# Patient Record
Sex: Male | Born: 1965 | Race: White | Hispanic: No | Marital: Married | Smoking: Never smoker
Health system: Southern US, Community
[De-identification: ages and names within clinical notes are randomized; demographics above are authoritative.]

## PROBLEM LIST (undated history)

## (undated) HISTORY — PX: HERNIA REPAIR: SHX51

## (undated) HISTORY — PX: APPENDECTOMY: SHX54

---

## 2008-04-06 ENCOUNTER — Inpatient Hospital Stay: Payer: Self-pay | Admitting: Surgery

## 2008-05-28 ENCOUNTER — Emergency Department: Payer: Self-pay | Admitting: Emergency Medicine

## 2008-05-28 ENCOUNTER — Inpatient Hospital Stay: Payer: Self-pay | Admitting: Internal Medicine

## 2013-11-18 ENCOUNTER — Ambulatory Visit: Payer: Self-pay | Admitting: Surgery

## 2014-04-18 ENCOUNTER — Ambulatory Visit: Payer: Self-pay | Admitting: Gastroenterology

## 2014-05-01 ENCOUNTER — Ambulatory Visit: Payer: Self-pay

## 2014-08-13 NOTE — Op Note (Signed)
PATIENT NAME:  [name], Brian B MR#:  [id number] DATE OF BIRTH:  December 05, 1965  DATE OF PROCEDURE:  11/18/2013  PREOPERATIVE DIAGNOSIS: Incarcerated umbilical hernia.   POSTOPERATIVE DIAGNOSIS: Incarcerated umbilical hernia.   PROCEDURE: Umbilical hernia repair.   SURGEON: Adella HareJ. Wilton Lanae Federer, M.D.   ANESTHESIA: General.   INDICATIONS: This 49 year old male has developed a painful bulge at the umbilicus, and a hernia was demonstrated on physical examination and identified incarceration, but did not appear to be intestine. Surgery was recommended for definitive treatment.   DESCRIPTION OF PROCEDURE: The patient was placed on the operating table in the supine position under general anesthesia. The abdomen was prepared with ChloraPrep and draped in a sterile manner.   A transversely oriented supraumbilical curvilinear incision was made, carried down through subcutaneous tissues to encounter an umbilical hernia sac, which appeared to contain incarcerated omentum. The sac was dissected free from surrounding structures and separated from the small fascial ring defect. The incarcerated omentum was palpated, and a high ligation of the sac was done with a 0 Surgilon suture ligature. The sac and the incarcerated omentum were amputated and were not sent for pathology. The stump was manipulated down through the fascial ring defect.   The fascia appeared to have satisfactory thickness and integrity, and with the small size of the defect, did not appear to need mesh. The repair was carried out with 0 Surgilon figure-of-8 sutures placing 2 of these for a transversely oriented suture line. The repair looked good. The subcutaneous tissues and fascia were infiltrated with 0.5% Sensorcaine with epinephrine. The skin of the umbilicus was sutured to the deep fascia with 5-0 Monocryl. The skin was closed with running 5-0 Monocryl subcuticular suture and Liquiband.   The patient appeared to be in satisfactory condition and  was prepared for transfer to the recovery room.   ____________________________ Shela CommonsJ. Renda RollsWilton Malya Cirillo, MD [email or web address] D: 11/18/2013 18:35:57 ET T: 11/18/2013 18:59:01 ET JOB#: [id number]  cc: Adella HareJ. Wilton Stephanee Barcomb, MD, <Dictator> Adella HareWILTON J Melda Mermelstein MD ELECTRONICALLY SIGNED 11/20/2013 11:40

## 2014-08-15 LAB — SURGICAL PATHOLOGY

## 2015-04-18 ENCOUNTER — Encounter: Payer: Self-pay | Admitting: Physician Assistant

## 2015-04-18 ENCOUNTER — Ambulatory Visit: Payer: Self-pay | Admitting: Physician Assistant

## 2015-04-18 VITALS — BP 110/75 | HR 84 | Temp 98.8°F

## 2015-04-18 DIAGNOSIS — R1032 Left lower quadrant pain: Secondary | ICD-10-CM

## 2015-04-18 LAB — POCT URINALYSIS DIPSTICK
Bilirubin, UA: NEGATIVE
Blood, UA: NEGATIVE
Glucose, UA: NEGATIVE
Ketones, UA: NEGATIVE
Leukocytes, UA: NEGATIVE
NITRITE UA: NEGATIVE
PROTEIN UA: NEGATIVE
Spec Grav, UA: 1.015
UROBILINOGEN UA: 0.2
pH, UA: 5.5

## 2015-04-18 MED ORDER — TRAMADOL HCL 50 MG PO TABS: 50.0000 mg | ORAL_TABLET | Freq: Three times a day (TID) | ORAL | Status: AC | PRN

## 2015-04-18 NOTE — Addendum Note (Signed)
Addended by: Catha BrowEACON, Malavika Lira T on: 04/18/2015 02:32 PM   Modules accepted: Orders

## 2015-04-18 NOTE — Progress Notes (Signed)
S: c/o llq pain, sx for 3 days, pain 3/10; no fever/chills, no v/d, no constipation, ?if pulled a muscle as its a dull pain, had colonoscopy last year and was told he has some diverticuli, pt states he doesn't feel like its related as pain is more with movement, hx of umbilical hernia, pain is relieved by ibuprofen and tramadol  O: vitals wnl, nad, abd soft nontender, no hernia noted, bs normal all 4 quads  A: llq pain, ?muscle strain  P: expressed concerns of diverticulitis, pt doesn't want to take antibiotics bc he feels he strained a muscle, rx tramadol 50mg  #15 nr; explained addictive properties to pt, is to return if pain increasing or has fever/chills

## 2016-11-15 ENCOUNTER — Emergency Department
Admission: EM | Admit: 2016-11-15 | Discharge: 2016-11-15 | Disposition: A | Discharge status: Home / Self Care | Payer: Worker's Compensation | Attending: Student in an Organized Health Care Education/Training Program | Admitting: Student in an Organized Health Care Education/Training Program

## 2016-11-15 ENCOUNTER — Encounter: Payer: Self-pay | Admitting: Emergency Medicine

## 2016-11-15 ENCOUNTER — Emergency Department: Payer: Worker's Compensation

## 2016-11-15 DIAGNOSIS — Y990 Civilian activity done for income or pay: Secondary | ICD-10-CM | POA: Diagnosis not present

## 2016-11-15 DIAGNOSIS — S4991XA Unspecified injury of right shoulder and upper arm, initial encounter: Secondary | ICD-10-CM | POA: Diagnosis present

## 2016-11-15 DIAGNOSIS — Y929 Unspecified place or not applicable: Secondary | ICD-10-CM | POA: Insufficient documentation

## 2016-11-15 DIAGNOSIS — S46011A Strain of muscle(s) and tendon(s) of the rotator cuff of right shoulder, initial encounter: Secondary | ICD-10-CM | POA: Diagnosis not present

## 2016-11-15 DIAGNOSIS — X500XXA Overexertion from strenuous movement or load, initial encounter: Secondary | ICD-10-CM | POA: Insufficient documentation

## 2016-11-15 DIAGNOSIS — Y93F2 Activity, caregiving, lifting: Secondary | ICD-10-CM | POA: Diagnosis not present

## 2016-11-15 MED ORDER — MELOXICAM 15 MG PO TABS
15.0000 mg | ORAL_TABLET | Freq: Every day | ORAL | 2 refills | Status: AC
Start: 2016-11-15 — End: 2016-11-25

## 2016-11-15 MED ORDER — MELOXICAM 7.5 MG PO TABS
7.5000 mg | ORAL_TABLET | Freq: Once | ORAL | Status: AC
  Administered 2016-11-15: 7.5 mg via ORAL
  Filled 2016-11-15: qty 1

## 2016-11-15 NOTE — ED Notes (Signed)
Laury AxonKyle Bucner called -- Biomedical scientistAssistant Supervisor for W[facility] Jr. CompanyCEMS called.  No drug screen required for WC.

## 2016-11-15 NOTE — ED Provider Notes (Signed)
[facility] Emergency Department Provider Note  ____________________________________________  Time seen: Approximately 7:10 PM  I have reviewed the triage vital signs and the nursing notes.   HISTORY  Chief Complaint Shoulder Injury    HPI Brian LarssonDavid Brian Valdez is a 51 y.o. male presenting to the emergency department with 2 out of 10 right shoulder pain after patient heard a pop while moving a patient today. Patient denies falls. He denies weakness, radiculopathy or changes in sensation of the upper extremities. Patient states that he is concerned because his wife recently had a subarachnoid hemorrhage and needs to be able to move his wife. He denies chest pain, chest tightness, shortness of breath and nausea. Patient has taken ibuprofen. History reviewed. No pertinent past medical history.  There are no active problems to display for this patient.   Past Surgical History:  Procedure Laterality Date  . APPENDECTOMY    . HERNIA REPAIR      Prior to Admission medications   Medication Sig Start Date End Date Taking? Authorizing Provider  meloxicam (MOBIC) 15 MG tablet Take 1 tablet (15 mg total) by mouth daily. 11/15/16 11/25/16  Orvil FeilWoods, Jonathon Castelo M, PA-C  traMADol (ULTRAM) 50 MG tablet Take 1 tablet (50 mg total) by mouth every 8 (eight) hours as needed. 04/18/15   Faythe GheeFisher, Susan W, PA-C    Allergies Patient has no known allergies.  No family history on file.  Social History Social History  Substance Use Topics  . Smoking status: Never Smoker  . Smokeless tobacco: Never Used  . Alcohol use No     Review of Systems  Constitutional: No fever/chills Eyes: No visual changes. No discharge ENT: No upper respiratory complaints. Cardiovascular: no chest pain. Respiratory: no cough. No SOB. Gastrointestinal: No abdominal pain.  No nausea, no vomiting.  No diarrhea.  No constipation. Musculoskeletal: Patient has right shoulder pain. Skin: Negative for rash,  abrasions, lacerations, ecchymosis. Neurological: Negative for headaches, focal weakness or numbness.  ____________________________________________   PHYSICAL EXAM:  VITAL SIGNS: ED Triage Vitals  Enc Vitals Group     BP 11/15/16 1717 (!) 142/83     Pulse Rate 11/15/16 1717 98     Resp --      Temp 11/15/16 1717 98.9 F (37.2 C)     Temp Source 11/15/16 1717 Oral     SpO2 11/15/16 1717 99 %     Weight 11/15/16 1714 156 lb (70.8 kg)     Height 11/15/16 1714 5\' 11"  (1.803 m)     Head Circumference --      Peak Flow --      Pain Score 11/15/16 1713 0     Pain Loc --      Pain Edu? --      Excl. in GC? --      Constitutional: Alert and oriented. Well appearing and in no acute distress. Eyes: Conjunctivae are normal. PERRL. EOMI. Cardiovascular: Normal rate, regular rhythm. Normal S1 and S2.  Good peripheral circulation. Respiratory: Normal respiratory effort without tachypnea or retractions. Lungs CTAB. Good air entry to the bases with no decreased or absent breath sounds. Musculoskeletal: Patient has 5 out of 5 strength in the upper extremities bilaterally. Patient is able to perform full range of motion at the right shoulder, right elbow and right wrist. No weakness was elicited with right rotator cuff testing. Patient had mild tenderness elicited with resisted external rotation at the right shoulder. No pain was elicited over the right AC joint. Palpable  radial and ulnar pulses, right. Neurologic:  Normal speech and language. No gross focal neurologic deficits are appreciated.  Skin:  Skin is warm, dry and intact. No rash noted. Psychiatric: Mood and affect are normal. Speech and behavior are normal. Patient exhibits appropriate insight and judgement.   ____________________________________________   LABS (all labs ordered are listed, but only abnormal results are displayed)  Labs Reviewed - No data to  display ____________________________________________  EKG   ____________________________________________  RADIOLOGY Geraldo PitterI, Reyansh Kushnir M Xue Low, personally viewed and evaluated these images (plain radiographs) as part of my medical decision making, as well as reviewing the written report by the radiologist.  Dg Shoulder Right  Result Date: 11/15/2016 CLINICAL DATA:  Patient with right shoulder pain after raising the shoulder. Initial encounter. EXAM: RIGHT SHOULDER - 2+ VIEW COMPARISON:  None. FINDINGS: Normal anatomic alignment. No evidence for acute fracture or dislocation. Visualized right hemithorax is unremarkable. IMPRESSION: No acute osseous abnormality. Electronically Signed   By: Annia Beltrew  Davis M.D.   On: 11/15/2016 17:57    ____________________________________________    PROCEDURES  Procedure(s) performed:    Procedures    Medications  meloxicam (MOBIC) tablet 7.5 mg (7.5 mg Oral Given 11/15/16 1835)     ____________________________________________   INITIAL IMPRESSION / ASSESSMENT AND PLAN / ED COURSE  Pertinent labs & imaging results that were available during my care of the patient were reviewed by me and considered in my medical decision making (see chart for details).  Review of the [place] CSRS was performed in accordance of the [facility] prior to dispensing any controlled drugs.     Assessment and plan Right shoulder pain Patient presents to the emergency department with right shoulder pain after lifting a patient earlier today. DG right shoulder reveals no acute abnormalities. Mild tenderness was elicited with resisted extension at the right shoulder. I suspect rotator cuff strain. Patient was given Mobic in the emergency department and was discharged with Mobic. Patient was advised to follow up with orthopedics as needed. Vital signs are reassuring prior to discharge. All patient questions were answered.  ____________________________________________  FINAL CLINICAL  IMPRESSION(S) / ED DIAGNOSES  Final diagnoses:  Strain of right rotator cuff capsule, initial encounter      NEW MEDICATIONS STARTED DURING THIS VISIT:  Discharge Medication List as of 11/15/2016  6:30 PM    START taking these medications   Details  meloxicam (MOBIC) 15 MG tablet Take 1 tablet (15 mg total) by mouth daily., Starting Fri 11/15/2016, Until Mon 11/25/2016, Print            This chart was dictated using voice recognition software/Dragon. Despite best efforts to proofread, errors can occur which can change the meaning. Any change was purely unintentional.    Orvil FeilWoods, Kerina Simoneau M, PA-C 11/15/16 1916    Willy Eddyobinson, Patrick, MD 11/16/16 [phone]

## 2016-11-15 NOTE — ED Triage Notes (Signed)
While transporting a patient to hospital, repositioning patient, pulling seatbelt across patient, felt right shoulder "pop".

## 2016-11-15 NOTE — ED Notes (Signed)
Per Brian MoodKyle Valdez no drug screen necessary.

## 2016-11-15 NOTE — ED Notes (Signed)
See triage note  States he felt a "pop" to right shoulder while trying to move a pt   No deformity noted   Increased discomfort with movement

## 2017-06-27 ENCOUNTER — Ambulatory Visit
Admission: RE | Admit: 2017-06-27 | Discharge: 2017-06-27 | Disposition: A | Discharge status: Home / Self Care | Payer: Worker's Compensation | Source: Ambulatory Visit | Attending: Physician Assistant | Admitting: Physician Assistant

## 2017-06-27 ENCOUNTER — Other Ambulatory Visit: Payer: Self-pay | Admitting: Physician Assistant

## 2017-06-27 DIAGNOSIS — M545 Low back pain: Secondary | ICD-10-CM | POA: Insufficient documentation

## 2017-06-27 DIAGNOSIS — M47816 Spondylosis without myelopathy or radiculopathy, lumbar region: Secondary | ICD-10-CM | POA: Insufficient documentation

## 2018-03-28 IMAGING — CR DG LUMBAR SPINE COMPLETE 4+V
5 series · 5 of 5 positions shown · non-contrast
Comparison: None.

CLINICAL DATA: Low back pain following moving a patient today,
initial encounter

EXAM:
LUMBAR SPINE - COMPLETE 4+ VIEW

[l-spine ap]
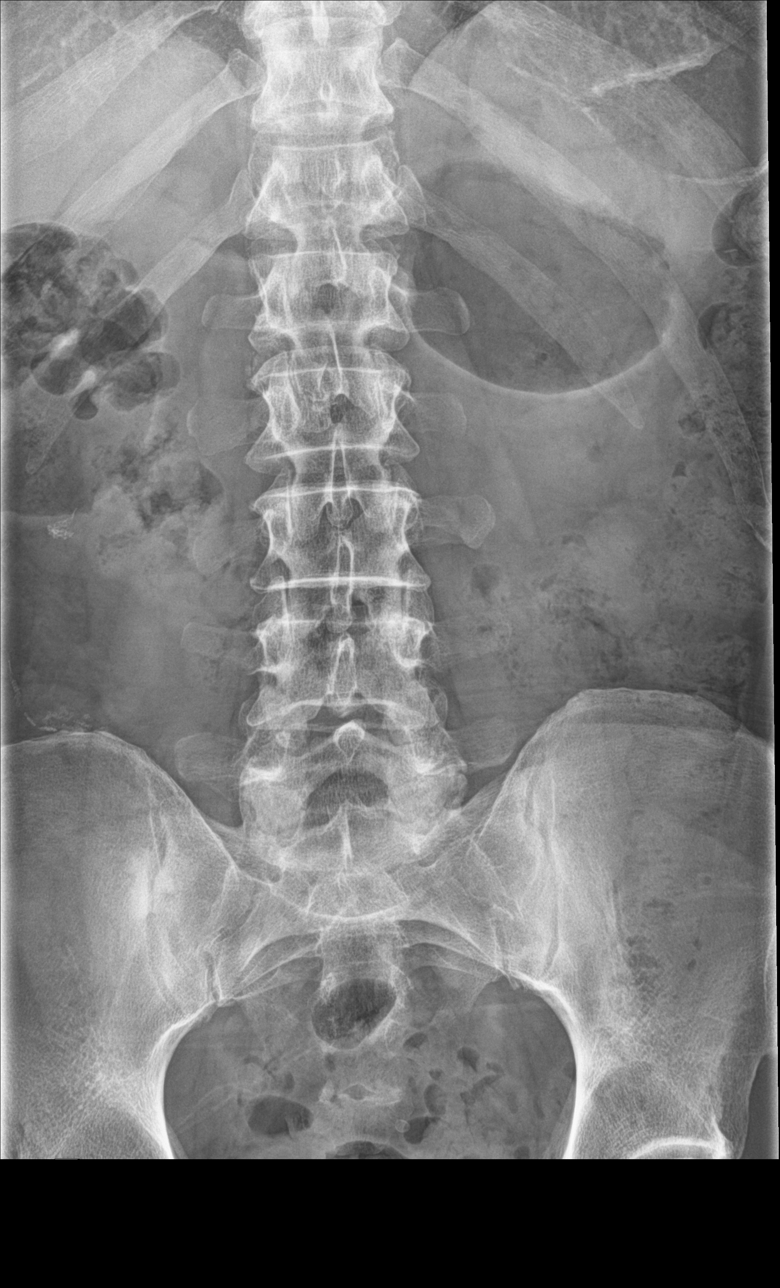

[l-spine obl (1 of 2)]
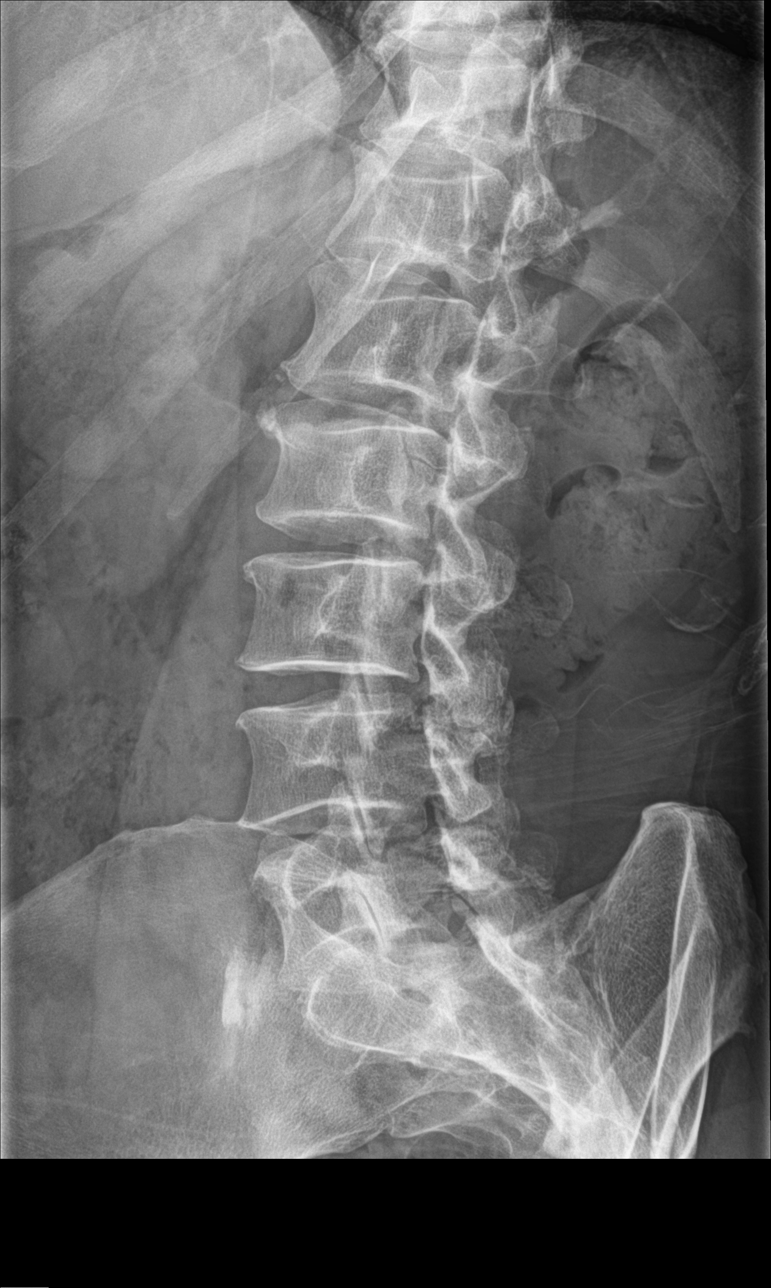

[l-spine obl (2 of 2)]
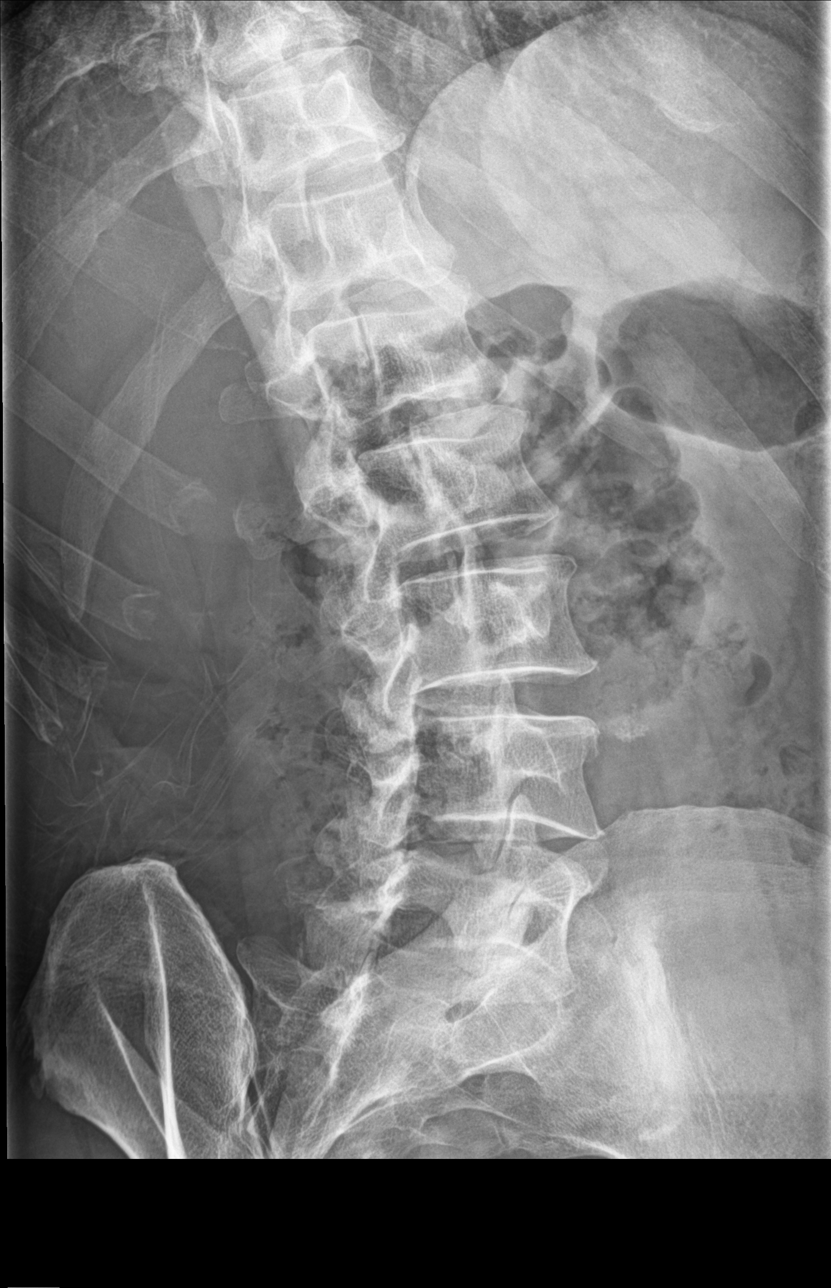

[l-spine lat]
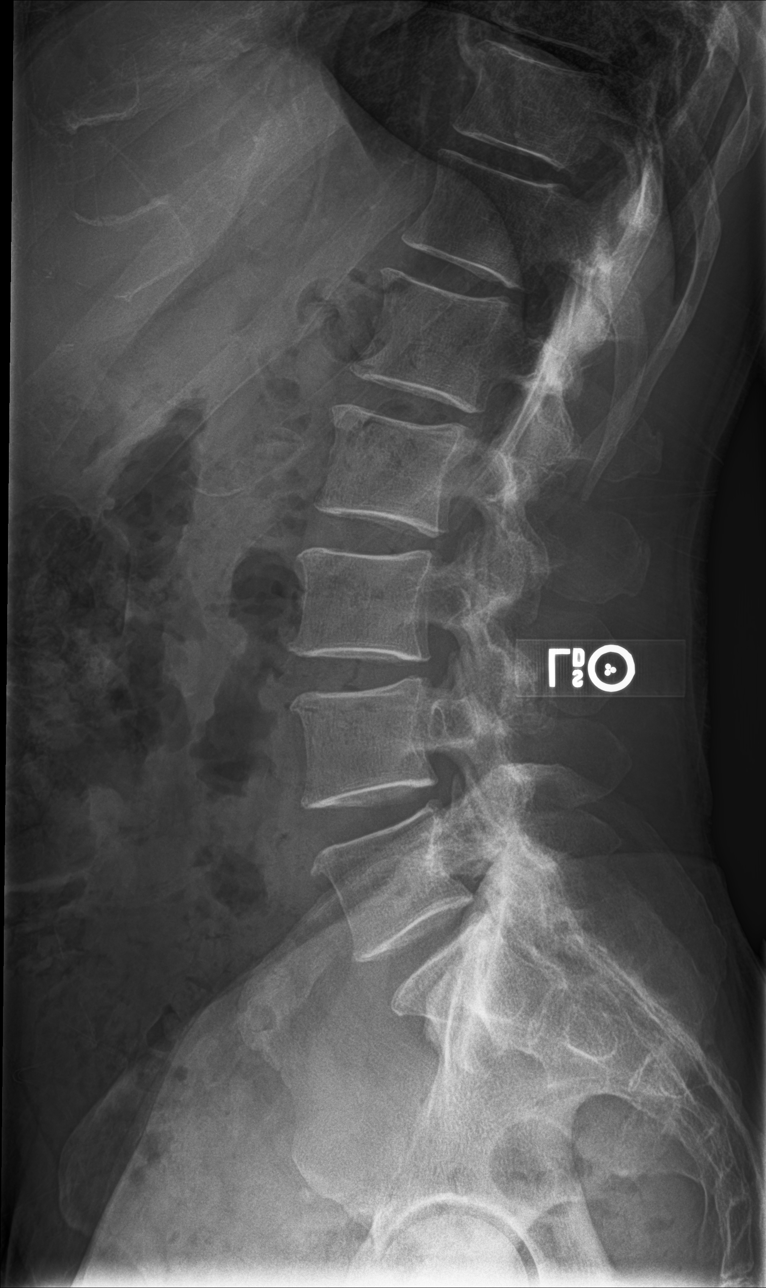

[l-spine spot]
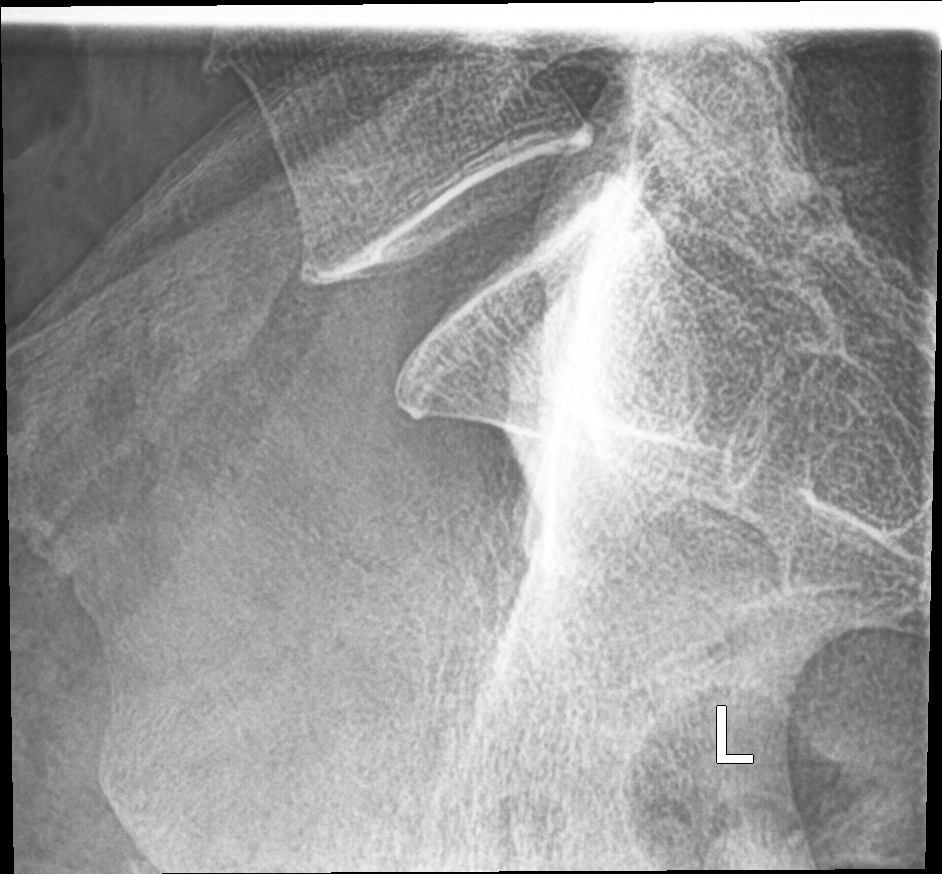

[5 of 5 positions shown; findings below may reference images not displayed]

FINDINGS: Five lumbar type vertebral bodies are well visualized. Vertebral
body height is well maintained. Mild osteophytic changes are noted.
Facet hypertrophic changes are noted at L5-S1. No anterolisthesis is
seen. No soft tissue abnormality is noted.
IMPRESSION: Mild degenerative change without acute abnormality

## 2019-03-04 ENCOUNTER — Ambulatory Visit: Payer: Self-pay | Admitting: Nurse Practitioner
